# Patient Record
Sex: Female | Born: 1965 | Hispanic: No | Marital: Single | Smoking: Former smoker
Health system: Southern US, Community
[De-identification: ages and names within clinical notes are randomized; demographics above are authoritative.]

## PROBLEM LIST (undated history)

## (undated) DIAGNOSIS — N6459 Other signs and symptoms in breast: Secondary | ICD-10-CM

## (undated) DIAGNOSIS — E039 Hypothyroidism, unspecified: Secondary | ICD-10-CM

## (undated) DIAGNOSIS — IMO0002 Reserved for concepts with insufficient information to code with codable children: Secondary | ICD-10-CM

## (undated) DIAGNOSIS — E079 Disorder of thyroid, unspecified: Secondary | ICD-10-CM

## (undated) HISTORY — DX: Reserved for concepts with insufficient information to code with codable children: IMO0002

## (undated) HISTORY — PX: NO PAST SURGERIES: SHX2092

## (undated) HISTORY — DX: Disorder of thyroid, unspecified: E07.9

---

## 2003-03-31 ENCOUNTER — Inpatient Hospital Stay (HOSPITAL_COMMUNITY): Admission: AD | Admit: 2003-03-31 | Discharge: 2003-04-02 | Payer: Self-pay | Admitting: Obstetrics

## 2003-05-13 ENCOUNTER — Inpatient Hospital Stay (HOSPITAL_COMMUNITY): Admission: EM | Admit: 2003-05-13 | Discharge: 2003-05-14 | Payer: Self-pay | Admitting: Emergency Medicine

## 2005-05-30 DIAGNOSIS — Z923 Personal history of irradiation: Secondary | ICD-10-CM

## 2005-05-30 HISTORY — DX: Personal history of irradiation: Z92.3

## 2011-01-10 DIAGNOSIS — IMO0002 Reserved for concepts with insufficient information to code with codable children: Secondary | ICD-10-CM

## 2011-01-10 DIAGNOSIS — R87619 Unspecified abnormal cytological findings in specimens from cervix uteri: Secondary | ICD-10-CM

## 2011-01-10 HISTORY — DX: Unspecified abnormal cytological findings in specimens from cervix uteri: R87.619

## 2011-01-10 HISTORY — DX: Reserved for concepts with insufficient information to code with codable children: IMO0002

## 2011-03-03 ENCOUNTER — Encounter: Payer: Self-pay | Admitting: Obstetrics & Gynecology

## 2011-04-08 ENCOUNTER — Encounter: Payer: Self-pay | Admitting: *Deleted

## 2011-04-11 ENCOUNTER — Ambulatory Visit (INDEPENDENT_AMBULATORY_CARE_PROVIDER_SITE_OTHER): Payer: Self-pay | Admitting: Family Medicine

## 2011-04-11 ENCOUNTER — Other Ambulatory Visit (HOSPITAL_COMMUNITY)
Admission: RE | Admit: 2011-04-11 | Discharge: 2011-04-11 | Disposition: A | Discharge status: Home / Self Care | Payer: Self-pay | Source: Ambulatory Visit | Attending: Family Medicine | Admitting: Family Medicine

## 2011-04-11 ENCOUNTER — Encounter: Payer: Self-pay | Admitting: Family Medicine

## 2011-04-11 DIAGNOSIS — IMO0002 Reserved for concepts with insufficient information to code with codable children: Secondary | ICD-10-CM

## 2011-04-11 DIAGNOSIS — R87612 Low grade squamous intraepithelial lesion on cytologic smear of cervix (LGSIL): Secondary | ICD-10-CM

## 2011-04-11 DIAGNOSIS — N870 Mild cervical dysplasia: Secondary | ICD-10-CM | POA: Insufficient documentation

## 2011-04-11 DIAGNOSIS — Z01812 Encounter for preprocedural laboratory examination: Secondary | ICD-10-CM

## 2011-04-11 LAB — POCT PREGNANCY, URINE: Preg Test, Ur: NEGATIVE

## 2011-04-11 NOTE — Patient Instructions (Signed)
Colposcopa, Cuidado posterior (Colposcopy, Care After) La colposcopa es un procedimiento en el que se utiliza una herramienta especial para magnificar la superficie del cuello del tero. Tambin es posible que se tome una muestra de tejido (biopsia). Esta muestra se observar para identificar la presencia de cncer cervical u otros problemas. Despus del procedimiento:  Podr sentir algunos clicos.   Recustese algunos minutos si se siente mareada.   Podr tener un sangrado que debera detenerse luego de [place].  CUIDADOS EN EL HOGAR  No tenga relaciones sexuales ni use tampones durante 2 o 3 das o segn le hayan indicado.   Slo tome medicamentos como lo indique su mdico.   Contine tomando las pastillas anticonceptivas de la forma habitual.  Averige los [place] de su anlisis Pregunte cundo estarn [employer] del examen. Asegrese de [employer]. SOLICITE AYUDA DE INMEDIATO SI:  Tiene un sangrado abundante o elimina cogulos.   Su temperatura es de 102 F (38.9 C) o mayor.   Brett Fairy secrecin vaginal anormal.   Tiene clicos que no se [employer].   Siente mareos, vrtigo o pierde el conocimiento (se desmaya).  ASEGRESE DE QUE:   Comprende estas instrucciones.   Controlar su enfermedad.   Solicitar ayuda de inmediato si no mejora o si empeora.  Document Released: 06/18/2010 Document Revised: 01/26/2011 [facility] Patient Information 2012 [place], [place].

## 2011-04-11 NOTE — Progress Notes (Signed)
Addended by: Levie Heritage on: 04/11/2011 02:21 PM   Modules accepted: Level of Service

## 2011-04-11 NOTE — Progress Notes (Addendum)
Patient referred to our clinic from health department for colposcopy due to not seeing the SCJ or extents of lesions.  Patient given informed consent, signed copy in the chart, time out was performed.  Placed in lithotomy position. Cervix viewed with speculum and colposcope after application of acetic acid.   Colposcopy adequate?  yes [place] lesions?12 o'clock, 2 o'clock, 10 o'clock. Punctation?12 o'clock Mosaicism?  No  Abnormal vasculature? 12 o'clock Biopsies?12, 2, 10 o'clock ECC?yes  Patient was given post procedure instructions.  She will return in 3 weeks for results.  Dr Penne Lash supervised and assisted in the procedure.

## 2011-05-05 ENCOUNTER — Encounter: Payer: Self-pay | Admitting: Family Medicine

## 2011-05-05 ENCOUNTER — Ambulatory Visit (INDEPENDENT_AMBULATORY_CARE_PROVIDER_SITE_OTHER): Payer: Self-pay | Admitting: Family Medicine

## 2011-05-05 VITALS — BP 151/81 | HR 77 | Temp 97.4°F | Ht 59.0 in | Wt 140.7 lb

## 2011-05-05 DIAGNOSIS — N870 Mild cervical dysplasia: Secondary | ICD-10-CM | POA: Insufficient documentation

## 2011-05-05 NOTE — Patient Instructions (Signed)
VPH (HPV Test) Se trata de una prueba que se utiliza para detectar una infeccin por VPH. Algunos tipos de VPH estn asociados con cncer cervical. Esta prueba tambin se utiliza para la deteccin de Optometrist. Esta prueba se realiza si usted es [employer], tiene sntomas de VPH (verrugas genitales), tiene 30 aos de edad o ms o el [place] ha revelado un resultado anormal.  La prueba de VPH se realiza para Clinical research associate una evidencia de infeccin de tipos de virus del papiloma humano de Conservator, museum/gallery (VPH). El VPH es un grupo de 100 virus relacionados que causan verrugas en la piel y genitales (tambin llamado condylomata). En general en hombres y mujeres jvenes, la mayora de las infecciones por VPH son de corta duracin y Equities trader benignos. Unos pocos tipos de VPH (como HPV-16, HPV-18, HPV-31, y HPV-45), sin embargo, se consideran de Conservator, museum/gallery. No suelen ocasionar verrugas visibles, pero son persistentes y estn relacionados con cncer de cuello del tero, del pene y Heritage manager formas de cncer genital. Tradicionalmente, la infeccin por VPH ha sido detectada como cambios anormales en la clulas en un extendido de [place], una prueba que se [place] al principio para Engineer, site del cuello del tero (la parte ms baja del tero) o enfermedades que podran llevar a Hotel manager. Durante un [place], se evala la "normalidad" de las clulas del cuello del tero mirndolas con un microscopio.  PREPARACIN PARA LA PRUEBA Se toma una muestra de clulas de la zona cervical del tero durante una examen plvico con una especie de esptula de madera, hisopo o cepillo y si se efecta adems la prueba de ADN de VPH, se coloca en un lquido preservativo especial. VALORES NORMALES La mayora de las veces, no es IT trainer tipo de VPH. Sin embargo, si se Biomedical engineer, los hallazgos ms comunes son:   El VPH del tipo 6 y 11 normalmente ocasiona verrugas venreas y (junto con los tipos  42, 3 y 24) un bajo riesgo de English as a second language teacher.   Los tipos de VPH 16, 18, 31, 33 y 36 tienen un riesgo mayor de English as a second language teacher.  SIGNIFICADO DEL EXAMEN El mdico observar los resultado de la prueba y Agricultural engineer con usted su importancia. Los [address] de referencia dependen de [address] factores, entre los que se incluyen la edad del [place], [place], cantidad de la [place] y mtodo de [place]. Los resultados numricos tienen diferentes significados segn Quarry manager. El informe de laboratorio deber especificar el rango de normalidad para su prueba.  En una prueba de Pap, los cambios de "grado bajo" indican que es probable la presencia de VPH y la necesidad de otros estudios complementarios. Marcos Eke de ADN para VPH positivo indica la presencia de un tipo de VPH de alto riesgo, pero la prueba no especifica qu tipo es. Si ambos son negativos, es poco probable que haya un alto riesgo de infeccin por VPH. Si la prueba de Pap es anormal pero el ADN VPH es negativo, se realizar un seguimiento y se pedirn otros controles.  OBTENCIN DE LOS RESULTADOS DEL EXAMEN  Asegrese de obtener los [employer]. Pregunte cmo puede obtenerlos si no se lo han informado. Es su responsabilidad retirar el resultado del [place].   El Photographer instrucciones y si es necesario un tratamiento.  PARA MS INFORMACIN  [email or web address] Document Released: 09/01/2008 Document Revised: 01/26/2011 [facility] Patient Information 2012 [place], [place].

## 2011-05-05 NOTE — Progress Notes (Signed)
Patient seen for follow up of colposcopy.  She reports no complications, vaginal discharge, vaginal bleeding, cramping, abdominal pain.    Vital signs reviewed.  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  Will have patient return for PAP in 6 and 12 months.  If both are negative, would resume normal screening.  If either are ASCUS, LGSIL, or HGSIL, would need colposcopy.

## 2011-11-09 ENCOUNTER — Encounter: Payer: Self-pay | Admitting: Family Medicine

## 2014-03-31 ENCOUNTER — Encounter: Payer: Self-pay | Admitting: Family Medicine

## 2015-01-15 ENCOUNTER — Encounter: Payer: Self-pay | Admitting: *Deleted

## 2016-08-02 ENCOUNTER — Other Ambulatory Visit (HOSPITAL_COMMUNITY)
Admission: RE | Admit: 2016-08-02 | Discharge: 2016-08-02 | Disposition: A | Discharge status: Home / Self Care | Payer: BLUE CROSS/BLUE SHIELD | Source: Ambulatory Visit | Attending: Obstetrics and Gynecology | Admitting: Obstetrics and Gynecology

## 2016-08-02 ENCOUNTER — Other Ambulatory Visit: Payer: Self-pay | Admitting: Obstetrics and Gynecology

## 2016-08-02 DIAGNOSIS — Z01419 Encounter for gynecological examination (general) (routine) without abnormal findings: Secondary | ICD-10-CM | POA: Insufficient documentation

## 2016-08-02 DIAGNOSIS — Z1151 Encounter for screening for human papillomavirus (HPV): Secondary | ICD-10-CM | POA: Diagnosis present

## 2016-08-03 LAB — CYTOLOGY - PAP
Diagnosis: NEGATIVE
HPV: NOT DETECTED

## 2019-08-15 ENCOUNTER — Other Ambulatory Visit: Payer: Self-pay | Admitting: Radiology

## 2019-08-15 DIAGNOSIS — N6453 Retraction of nipple: Secondary | ICD-10-CM

## 2019-08-22 ENCOUNTER — Encounter: Payer: Self-pay | Admitting: *Deleted

## 2019-09-05 ENCOUNTER — Other Ambulatory Visit: Payer: Self-pay

## 2019-09-05 ENCOUNTER — Ambulatory Visit
Admission: RE | Admit: 2019-09-05 | Discharge: 2019-09-05 | Disposition: A | Discharge status: Home / Self Care | Payer: BLUE CROSS/BLUE SHIELD | Source: Ambulatory Visit | Attending: Radiology | Admitting: Radiology

## 2019-09-05 DIAGNOSIS — N6453 Retraction of nipple: Secondary | ICD-10-CM

## 2019-09-05 MED ORDER — GADOBUTROL 1 MMOL/ML IV SOLN
8.0000 mL | Freq: Once | INTRAVENOUS | Status: AC | PRN
  Administered 2019-09-05: 8 mL via INTRAVENOUS

## 2019-09-09 ENCOUNTER — Encounter: Payer: Self-pay | Admitting: *Deleted

## 2019-10-01 ENCOUNTER — Encounter: Payer: Self-pay | Admitting: *Deleted

## 2019-10-07 ENCOUNTER — Other Ambulatory Visit: Payer: Self-pay | Admitting: Radiology

## 2019-12-24 ENCOUNTER — Other Ambulatory Visit: Payer: Self-pay | Admitting: General Surgery

## 2020-01-10 ENCOUNTER — Encounter (HOSPITAL_BASED_OUTPATIENT_CLINIC_OR_DEPARTMENT_OTHER): Payer: Self-pay | Admitting: General Surgery

## 2020-01-10 ENCOUNTER — Other Ambulatory Visit: Payer: Self-pay

## 2020-01-13 ENCOUNTER — Other Ambulatory Visit (HOSPITAL_COMMUNITY)
Admission: RE | Admit: 2020-01-13 | Discharge: 2020-01-13 | Disposition: A | Discharge status: Home / Self Care | Payer: Medicaid Other | Source: Ambulatory Visit | Attending: General Surgery | Admitting: General Surgery

## 2020-01-13 DIAGNOSIS — Z01812 Encounter for preprocedural laboratory examination: Secondary | ICD-10-CM | POA: Diagnosis present

## 2020-01-13 DIAGNOSIS — Z20822 Contact with and (suspected) exposure to covid-19: Secondary | ICD-10-CM | POA: Diagnosis not present

## 2020-01-13 LAB — SARS CORONAVIRUS 2 (TAT 6-24 HRS): SARS Coronavirus 2: NEGATIVE

## 2020-01-16 ENCOUNTER — Ambulatory Visit (HOSPITAL_BASED_OUTPATIENT_CLINIC_OR_DEPARTMENT_OTHER): Payer: Medicaid Other | Admitting: Anesthesiology

## 2020-01-16 ENCOUNTER — Other Ambulatory Visit: Payer: Self-pay

## 2020-01-16 ENCOUNTER — Ambulatory Visit (HOSPITAL_BASED_OUTPATIENT_CLINIC_OR_DEPARTMENT_OTHER)
Admission: RE | Admit: 2020-01-16 | Discharge: 2020-01-16 | Disposition: A | Discharge status: Home / Self Care | Payer: Medicaid Other | Source: Ambulatory Visit | Attending: General Surgery | Admitting: General Surgery

## 2020-01-16 ENCOUNTER — Encounter (HOSPITAL_BASED_OUTPATIENT_CLINIC_OR_DEPARTMENT_OTHER): Payer: Self-pay | Admitting: General Surgery

## 2020-01-16 ENCOUNTER — Encounter (HOSPITAL_BASED_OUTPATIENT_CLINIC_OR_DEPARTMENT_OTHER)
Admission: RE | Disposition: A | Discharge status: Home / Self Care | Payer: Self-pay | Source: Ambulatory Visit | Attending: General Surgery

## 2020-01-16 DIAGNOSIS — E039 Hypothyroidism, unspecified: Secondary | ICD-10-CM | POA: Diagnosis not present

## 2020-01-16 DIAGNOSIS — R928 Other abnormal and inconclusive findings on diagnostic imaging of breast: Secondary | ICD-10-CM | POA: Diagnosis present

## 2020-01-16 DIAGNOSIS — Z87891 Personal history of nicotine dependence: Secondary | ICD-10-CM | POA: Diagnosis not present

## 2020-01-16 DIAGNOSIS — Z803 Family history of malignant neoplasm of breast: Secondary | ICD-10-CM | POA: Diagnosis not present

## 2020-01-16 DIAGNOSIS — E669 Obesity, unspecified: Secondary | ICD-10-CM | POA: Insufficient documentation

## 2020-01-16 DIAGNOSIS — Z6831 Body mass index (BMI) 31.0-31.9, adult: Secondary | ICD-10-CM | POA: Diagnosis not present

## 2020-01-16 DIAGNOSIS — Z79890 Hormone replacement therapy (postmenopausal): Secondary | ICD-10-CM | POA: Insufficient documentation

## 2020-01-16 DIAGNOSIS — D242 Benign neoplasm of left breast: Secondary | ICD-10-CM | POA: Diagnosis not present

## 2020-01-16 DIAGNOSIS — N6489 Other specified disorders of breast: Secondary | ICD-10-CM | POA: Insufficient documentation

## 2020-01-16 HISTORY — DX: Other signs and symptoms in breast: N64.59

## 2020-01-16 HISTORY — DX: Hypothyroidism, unspecified: E03.9

## 2020-01-16 HISTORY — PX: BREAST BIOPSY: SHX20

## 2020-01-16 SURGERY — BREAST BIOPSY
Anesthesia: General | Site: Breast | Laterality: Left

## 2020-01-16 MED ORDER — PROPOFOL 10 MG/ML IV BOLUS
INTRAVENOUS | Status: DC | PRN
  Administered 2020-01-16: 150 mg via INTRAVENOUS

## 2020-01-16 MED ORDER — FENTANYL CITRATE (PF) 100 MCG/2ML IJ SOLN
INTRAMUSCULAR | Status: DC | PRN
Start: 2020-01-16 — End: 2020-01-16
  Administered 2020-01-16 (×2): 50 ug via INTRAVENOUS

## 2020-01-16 MED ORDER — DEXAMETHASONE SODIUM PHOSPHATE 10 MG/ML IJ SOLN
INTRAMUSCULAR | Status: AC
  Filled 2020-01-16: qty 5

## 2020-01-16 MED ORDER — FENTANYL CITRATE (PF) 100 MCG/2ML IJ SOLN: 25.0000 ug | INTRAMUSCULAR | Status: DC | PRN

## 2020-01-16 MED ORDER — GABAPENTIN 100 MG PO CAPS
ORAL_CAPSULE | ORAL | Status: AC
  Filled 2020-01-16: qty 1

## 2020-01-16 MED ORDER — KETOROLAC TROMETHAMINE 15 MG/ML IJ SOLN
INTRAMUSCULAR | Status: AC
  Filled 2020-01-16: qty 1

## 2020-01-16 MED ORDER — ACETAMINOPHEN 500 MG PO TABS
1000.0000 mg | ORAL_TABLET | ORAL | Status: AC
  Administered 2020-01-16: 1000 mg via ORAL

## 2020-01-16 MED ORDER — PROMETHAZINE HCL 25 MG/ML IJ SOLN: 6.2500 mg | INTRAMUSCULAR | Status: DC | PRN

## 2020-01-16 MED ORDER — ACETAMINOPHEN 500 MG PO TABS
ORAL_TABLET | ORAL | Status: AC
  Filled 2020-01-16: qty 2

## 2020-01-16 MED ORDER — ONDANSETRON HCL 4 MG/2ML IJ SOLN
INTRAMUSCULAR | Status: DC | PRN
  Administered 2020-01-16: 4 mg via INTRAVENOUS

## 2020-01-16 MED ORDER — FENTANYL CITRATE (PF) 100 MCG/2ML IJ SOLN
INTRAMUSCULAR | Status: AC
Start: 2020-01-16 — End: ?
  Filled 2020-01-16: qty 2

## 2020-01-16 MED ORDER — BACITRACIN ZINC 500 UNIT/GM EX OINT
TOPICAL_OINTMENT | CUTANEOUS | Status: AC
  Filled 2020-01-16: qty 1.8

## 2020-01-16 MED ORDER — ENSURE PRE-SURGERY PO LIQD: 296.0000 mL | Freq: Once | ORAL | Status: DC

## 2020-01-16 MED ORDER — PROPOFOL 500 MG/50ML IV EMUL
INTRAVENOUS | Status: AC
  Filled 2020-01-16: qty 50

## 2020-01-16 MED ORDER — LIDOCAINE 2% (20 MG/ML) 5 ML SYRINGE
INTRAMUSCULAR | Status: AC
  Filled 2020-01-16: qty 30

## 2020-01-16 MED ORDER — EPHEDRINE 5 MG/ML INJ
INTRAVENOUS | Status: AC
  Filled 2020-01-16: qty 20

## 2020-01-16 MED ORDER — DEXAMETHASONE SODIUM PHOSPHATE 4 MG/ML IJ SOLN
INTRAMUSCULAR | Status: DC | PRN
  Administered 2020-01-16: 4 mg via INTRAVENOUS

## 2020-01-16 MED ORDER — GABAPENTIN 100 MG PO CAPS
100.0000 mg | ORAL_CAPSULE | ORAL | Status: AC
  Administered 2020-01-16: 100 mg via ORAL

## 2020-01-16 MED ORDER — LIDOCAINE HCL (CARDIAC) PF 100 MG/5ML IV SOSY
PREFILLED_SYRINGE | INTRAVENOUS | Status: DC | PRN
  Administered 2020-01-16: 60 mg via INTRAVENOUS

## 2020-01-16 MED ORDER — ONDANSETRON HCL 4 MG/2ML IJ SOLN
INTRAMUSCULAR | Status: AC
  Filled 2020-01-16: qty 26

## 2020-01-16 MED ORDER — CEFAZOLIN SODIUM-DEXTROSE 2-4 GM/100ML-% IV SOLN
INTRAVENOUS | Status: AC
  Filled 2020-01-16: qty 100

## 2020-01-16 MED ORDER — CEFAZOLIN SODIUM-DEXTROSE 2-4 GM/100ML-% IV SOLN
2.0000 g | INTRAVENOUS | Status: AC
  Administered 2020-01-16: 2 g via INTRAVENOUS

## 2020-01-16 MED ORDER — LACTATED RINGERS IV SOLN: INTRAVENOUS | Status: DC

## 2020-01-16 MED ORDER — PHENYLEPHRINE 40 MCG/ML (10ML) SYRINGE FOR IV PUSH (FOR BLOOD PRESSURE SUPPORT)
PREFILLED_SYRINGE | INTRAVENOUS | Status: AC
  Filled 2020-01-16: qty 30

## 2020-01-16 MED ORDER — MIDAZOLAM HCL 5 MG/5ML IJ SOLN
INTRAMUSCULAR | Status: DC | PRN
  Administered 2020-01-16: 2 mg via INTRAVENOUS

## 2020-01-16 MED ORDER — BUPIVACAINE HCL (PF) 0.25 % IJ SOLN
INTRAMUSCULAR | Status: DC | PRN
  Administered 2020-01-16: 8 mL

## 2020-01-16 MED ORDER — KETOROLAC TROMETHAMINE 15 MG/ML IJ SOLN
15.0000 mg | INTRAMUSCULAR | Status: AC
  Administered 2020-01-16: 15 mg via INTRAVENOUS

## 2020-01-16 MED ORDER — MIDAZOLAM HCL 2 MG/2ML IJ SOLN
INTRAMUSCULAR | Status: AC
  Filled 2020-01-16: qty 2

## 2020-01-16 SURGICAL SUPPLY — 58 items
APPLIER CLIP 9.375 MED OPEN (MISCELLANEOUS)
BINDER BREAST LRG (GAUZE/BANDAGES/DRESSINGS) IMPLANT
BINDER BREAST MEDIUM (GAUZE/BANDAGES/DRESSINGS) IMPLANT
BINDER BREAST XLRG (GAUZE/BANDAGES/DRESSINGS) ×3 IMPLANT
BINDER BREAST XXLRG (GAUZE/BANDAGES/DRESSINGS) IMPLANT
BLADE SURG 15 STRL LF DISP TIS (BLADE) ×1 IMPLANT
BLADE SURG 15 STRL SS (BLADE) ×2
CANISTER SUCT 1200ML W/VALVE (MISCELLANEOUS) IMPLANT
CHLORAPREP W/TINT 26 (MISCELLANEOUS) ×3 IMPLANT
CLIP APPLIE 9.375 MED OPEN (MISCELLANEOUS) IMPLANT
CLIP VESOCCLUDE SM WIDE 6/CT (CLIP) IMPLANT
CLOSURE WOUND 1/2 X4 (GAUZE/BANDAGES/DRESSINGS) ×1
COVER BACK TABLE 60X90IN (DRAPES) ×3 IMPLANT
COVER MAYO STAND STRL (DRAPES) ×3 IMPLANT
COVER WAND RF STERILE (DRAPES) IMPLANT
DECANTER SPIKE VIAL GLASS SM (MISCELLANEOUS) IMPLANT
DERMABOND ADVANCED (GAUZE/BANDAGES/DRESSINGS) ×2
DERMABOND ADVANCED .7 DNX12 (GAUZE/BANDAGES/DRESSINGS) ×1 IMPLANT
DRAPE LAPAROSCOPIC ABDOMINAL (DRAPES) ×3 IMPLANT
DRAPE UTILITY XL STRL (DRAPES) ×3 IMPLANT
DRSG TEGADERM 4X4.75 (GAUZE/BANDAGES/DRESSINGS) IMPLANT
ELECT COATED BLADE 2.86 ST (ELECTRODE) ×3 IMPLANT
ELECT REM PT RETURN 9FT ADLT (ELECTROSURGICAL) ×3
ELECTRODE REM PT RTRN 9FT ADLT (ELECTROSURGICAL) ×1 IMPLANT
GAUZE SPONGE 4X4 12PLY STRL LF (GAUZE/BANDAGES/DRESSINGS) ×3 IMPLANT
GLOVE BIO SURGEON STRL SZ7 (GLOVE) ×3 IMPLANT
GLOVE BIOGEL PI IND STRL 6.5 (GLOVE) ×1 IMPLANT
GLOVE BIOGEL PI IND STRL 7.0 (GLOVE) ×1 IMPLANT
GLOVE BIOGEL PI IND STRL 7.5 (GLOVE) ×1 IMPLANT
GLOVE BIOGEL PI INDICATOR 6.5 (GLOVE) ×2
GLOVE BIOGEL PI INDICATOR 7.0 (GLOVE) ×2
GLOVE BIOGEL PI INDICATOR 7.5 (GLOVE) ×2
GLOVE ECLIPSE 6.5 STRL STRAW (GLOVE) ×3 IMPLANT
GOWN STRL REUS W/ TWL LRG LVL3 (GOWN DISPOSABLE) ×3 IMPLANT
GOWN STRL REUS W/TWL LRG LVL3 (GOWN DISPOSABLE) ×6
HEMOSTAT ARISTA ABSORB 3G PWDR (HEMOSTASIS) IMPLANT
KIT MARKER MARGIN INK (KITS) ×3 IMPLANT
NEEDLE HYPO 25X1 1.5 SAFETY (NEEDLE) ×3 IMPLANT
NS IRRIG 1000ML POUR BTL (IV SOLUTION) IMPLANT
PACK BASIN DAY SURGERY FS (CUSTOM PROCEDURE TRAY) ×3 IMPLANT
PENCIL SMOKE EVACUATOR (MISCELLANEOUS) ×3 IMPLANT
RETRACTOR ONETRAX LX 90X20 (MISCELLANEOUS) IMPLANT
SLEEVE SCD COMPRESS KNEE MED (MISCELLANEOUS) ×3 IMPLANT
SPONGE LAP 4X18 RFD (DISPOSABLE) ×3 IMPLANT
STRIP CLOSURE SKIN 1/2X4 (GAUZE/BANDAGES/DRESSINGS) ×2 IMPLANT
SUT MNCRL AB 4-0 PS2 18 (SUTURE) IMPLANT
SUT MON AB 5-0 PS2 18 (SUTURE) ×3 IMPLANT
SUT SILK 2 0 SH (SUTURE) IMPLANT
SUT VIC AB 2-0 SH 27 (SUTURE) ×3
SUT VIC AB 2-0 SH 27XBRD (SUTURE) ×1 IMPLANT
SUT VIC AB 3-0 SH 27 (SUTURE) ×2
SUT VIC AB 3-0 SH 27X BRD (SUTURE) ×1 IMPLANT
SYR CONTROL 10ML LL (SYRINGE) ×3 IMPLANT
TOWEL GREEN STERILE FF (TOWEL DISPOSABLE) ×3 IMPLANT
TRAY FAXITRON CT DISP (TRAY / TRAY PROCEDURE) IMPLANT
TUBE CONNECTING 20'X1/4 (TUBING)
TUBE CONNECTING 20X1/4 (TUBING) IMPLANT
YANKAUER SUCT BULB TIP NO VENT (SUCTIONS) IMPLANT

## 2020-01-16 NOTE — Op Note (Signed)
Preoperative diagnosis: Left breast inverted nipple and MRI abnormality of retroareolar region Postoperative diagnosis: Same as above Procedure: Left breast excisional biopsy Surgeon: Dr. Serita Grammes Anesthesia: General Specimens: 1.  Left breast tissue marked with paint 2.  Left nipple specimen Complications: None Drains: None Estimated blood loss: Minimal Sponge and count was correct at completion Disposition to recovery in stable condition  Indications:54 yof whofor past year has had some left nipple itching and inversion. no discharge. no mass noted by her.she has undergone evaluation with first normal [place]. she has had an mri that shows some symmetric enhancement behind left nipple not amenable to biopsy. she then underwent US biopsy by Dr Luan Pulling with what appeared to be ra mass and this is dilated duct only. she is here to discuss options.  We elected to proceed with an excision of this retroareolar area.  Procedure: After informed consent was obtained the patient was taken to the operating room.  I discussed this with an interpreter present prior to beginning again.  She was placed under general anesthesia.  She was given antibiotics.  SCDs were in place.  She was prepped and draped in the standard sterile surgical fashion.  Surgical timeout was then performed.  She had some crusty areas in the nipple due to inversion that I removed.  There was no other abnormality really of the nipple outside of the inversion once I was able to examine it.  I then infiltrated Marcaine around the entire nipple areolar complex.  I made a periareolar incision in order to hide the scar later.  I then dissected under the nipple and areola and remove the retroareolar tissue.  I marked this with paint.  I then remove some very hard tissue that was excision of her nipple without broaching the skin.  This was passed off the table as a separate specimen.  I then closed the breast tissue after obtained  hemostasis with 2-0 Vicryl suture.  The skin was closed with 3-0 Vicryl and 5-0 Monocryl.  Glue and Steri-Strips were applied.  She tolerated this well was extubated and transferred to recovery stable.

## 2020-01-16 NOTE — Anesthesia Postprocedure Evaluation (Signed)
Anesthesia Post Note  Patient: Hser Belanger  Procedure(s) Performed: LEFT BREAST RETROAREOLAR EXCISIONAL BIOPSY (Left Breast)     Patient location during evaluation: PACU Anesthesia Type: General Level of consciousness: awake and alert, awake and oriented Pain management: pain level controlled Vital Signs Assessment: post-procedure vital signs reviewed and stable Respiratory status: spontaneous breathing, nonlabored ventilation, respiratory function stable and patient connected to nasal cannula oxygen Cardiovascular status: blood pressure returned to baseline and stable Postop Assessment: no apparent nausea or vomiting Anesthetic complications: no   No complications documented.  Last Vitals:  Vitals:   01/16/20 0900 01/16/20 0915  BP: 109/67 126/86  Pulse: 69 79  Resp: 15 16  Temp:    SpO2: 97% 98%    Last Pain:  Vitals:   01/16/20 0915  PainSc: 2                  Catalina Gravel

## 2020-01-16 NOTE — Anesthesia Procedure Notes (Signed)
Procedure Name: LMA Insertion Date/Time: 01/16/2020 8:14 AM Performed by: Signe Colt, CRNA Pre-anesthesia Checklist: Patient identified, Emergency Drugs available, Suction available and Patient being monitored Patient Re-evaluated:Patient Re-evaluated prior to induction Oxygen Delivery Method: Circle system utilized Preoxygenation: Pre-oxygenation with 100% oxygen Induction Type: IV induction Ventilation: Mask ventilation without difficulty LMA: LMA inserted LMA Size: 4.0 Number of attempts: 1 Airway Equipment and Method: Bite block Placement Confirmation: positive ETCO2 Tube secured with: Tape Dental Injury: Teeth and Oropharynx as per pre-operative assessment

## 2020-01-16 NOTE — Transfer of Care (Signed)
Immediate Anesthesia Transfer of Care Note  Patient: Jaime Dillon  Procedure(s) Performed: LEFT BREAST RETROAREOLAR EXCISIONAL BIOPSY (Left Breast)  Patient Location: PACU  Anesthesia Type:General  Level of Consciousness: sedated  Airway & Oxygen Therapy: Patient Spontanous Breathing and Patient connected to face mask oxygen  Post-op Assessment: Report given to RN and Post -op Vital signs reviewed and stable  Post vital signs: Reviewed and stable  Last Vitals:  Vitals Value Taken Time  BP 95/56 01/16/20 0845  Temp    Pulse 69 01/16/20 0846  Resp 32 01/16/20 0846  SpO2 97 % 01/16/20 0846  Vitals shown include unvalidated device data.  Last Pain:  Vitals:   01/16/20 0722  PainSc: 0-No pain      Patients Stated Pain Goal: 3 (17/51/02 5852)  Complications: No complications documented.

## 2020-01-16 NOTE — Discharge Instructions (Signed)
[facility] Office Phone Number [phone]  BREAST BIOPSY/ PARTIAL MASTECTOMY: POST OP INSTRUCTIONS Take 400 mg of ibuprofen every 8 hours or 650 mg tylenol every 6 hours for next 72 hours then as needed. Use ice several times daily also. Always review your discharge instruction sheet given to you by the facility where your surgery was performed.  IF YOU HAVE DISABILITY OR FAMILY LEAVE FORMS, YOU MUST BRING THEM TO THE OFFICE FOR PROCESSING.  DO NOT GIVE THEM TO YOUR DOCTOR.  1. A prescription for pain medication may be given to you upon discharge.  Take your pain medication as prescribed, if needed.  If narcotic pain medicine is not needed, then you may take acetaminophen (Tylenol), naprosyn (Alleve) or ibuprofen (Advil) as needed. 2. Take your usually prescribed medications unless otherwise directed 3. If you need a refill on your pain medication, please contact your pharmacy.  They will contact our office to request authorization.  Prescriptions will not be filled after 5pm or on week-ends. 4. You should eat very light the first 24 hours after surgery, such as soup, crackers, pudding, etc.  Resume your normal diet the day after surgery. 5. Most patients will experience some swelling and bruising in the breast.  Ice packs and a good support bra will help.  Wear the breast binder provided or a sports bra for 72 hours day and night.  After that wear a sports bra during the day until you return to the office. Swelling and bruising can take several days to resolve.  6. It is common to experience some constipation if taking pain medication after surgery.  Increasing fluid intake and taking a stool softener will usually help or prevent this problem from occurring.  A mild laxative (Milk of Magnesia or Miralax) should be taken according to package directions if there are no bowel movements after 48 hours. 7. Unless discharge instructions indicate otherwise, you may remove your bandages 48  hours after surgery and you may shower at that time.  You may have steri-strips (small skin tapes) in place directly over the incision.  These strips should be left on the skin for 7-10 days and will come off on their own.  If your surgeon used skin glue on the incision, you may shower in 24 hours.  The glue will flake off over the next 2-3 weeks.  Any sutures or staples will be removed at the office during your follow-up visit. 8. ACTIVITIES:  You may resume regular daily activities (gradually increasing) beginning the next day.  Wearing a good support bra or sports bra minimizes pain and swelling.  You may have sexual intercourse when it is comfortable. a. You may drive when you no longer are taking prescription pain medication, you can comfortably wear a seatbelt, and you can safely maneuver your car and apply brakes. b. RETURN TO WORK:  ______________________________________________________________________________________ 9. You should see your doctor in the office for a follow-up appointment approximately two weeks after your surgery.  Your doctor's nurse will typically make your follow-up appointment when she calls you with your pathology report.  Expect your pathology report 3-4 business days after your surgery.  You may call to check if you do not hear from us after three days. 10. OTHER INSTRUCTIONS: _______________________________________________________________________________________________ _____________________________________________________________________________________________________________________________________ _____________________________________________________________________________________________________________________________________ _____________________________________________________________________________________________________________________________________  WHEN TO CALL DR WAKEFIELD: 1. Fever over 101.0 2. Nausea and/or vomiting. 3. Extreme swelling or  bruising. 4. Continued bleeding from incision. 5. Increased pain, redness, or drainage from the incision.  The clinic   staff is available to answer your questions during regular business hours.  Please don't hesitate to call and ask to speak to one of the nurses for clinical concerns.  If you have a medical emergency, go to the nearest emergency room or call 911.  A surgeon from [facility] is always on call at the hospital.  For further questions, please visit [email or web address]   No Tylenol until 1:30 pm No Ibuprofen or Motrin until 1:30 pm  Post Anesthesia Home Care Instructions  Activity: Get plenty of rest for the remainder of the day. A responsible individual must stay with you for 24 hours following the procedure.  For the next 24 hours, DO NOT: -Drive a car -Paediatric nurse -Drink alcoholic beverages -Take any medication unless instructed by your physician -Make any legal decisions or sign important papers.  Meals: Start with liquid foods such as gelatin or soup. Progress to regular foods as tolerated. Avoid greasy, spicy, heavy foods. If nausea and/or vomiting occur, drink only clear liquids until the nausea and/or vomiting subsides. Call your physician if vomiting continues.  Special Instructions/Symptoms: Your throat may feel dry or sore from the anesthesia or the breathing tube placed in your throat during surgery. If this causes discomfort, gargle with warm salt water. The discomfort should disappear within 24 hours.  If you had a scopolamine patch placed behind your ear for the management of post- operative nausea and/or vomiting:  1. The medication in the patch is effective for 72 hours, after which it should be removed.  Wrap patch in a tissue and discard in the trash. Wash hands thoroughly with soap and water. 2. You may remove the patch earlier than 72 hours if you experience unpleasant side effects which may include dry mouth, dizziness or  visual disturbances. 3. Avoid touching the patch. Wash your hands with soap and water after contact with the patch.

## 2020-01-16 NOTE — H&P (Signed)
54 yof spanish speaking, daughter interpreted she declined other interpreter. she for past year has had some left nipple itching and inversion. no discharge. no mass noted by her. no prior history. she has fh of aunt with breast cancer over age 55 she has undergone evaluation with first normal [place]. she has had an mri that shows some symmetric enhancement behind left nipple not amenable to biopsy. she then underwent US biopsy by Dr Luan Pulling with what appeared to be ra mass and this is dilated duct only. she is here to discuss options.  Allergies (Chanel Teressa Senter, CMA; 12/24/2019 2:50 PM) No Known Drug Allergies  [12/24/2019]: Allergies Reconciled   Medication History (Chanel Teressa Senter, CMA; 12/24/2019 2:50 PM) Synthroid (137MCG Tablet, Oral) Active. Medications Reconciled  Pregnancy / Birth History Antonietta Jewel, [place]; 12/24/2019 2:50 PM) Para  3  Other Problems (Chanel Teressa Senter, CMA; 12/24/2019 2:50 PM) Thyroid Disease   Review of Systems ([place]; 12/24/2019 2:50 PM) General Not Present- Appetite Loss, Chills, Fatigue, Fever, Night Sweats, Weight Gain and Weight Loss. Skin Not Present- Change in Wart/Mole, Dryness, Hives, Jaundice, New Lesions, Non-Healing Wounds, Rash and Ulcer. HEENT Not Present- Earache, Hearing Loss, Hoarseness, Nose Bleed, Oral Ulcers, Ringing in the Ears, Seasonal Allergies, Sinus Pain, Sore Throat, Visual Disturbances, Wears glasses/contact lenses and Yellow Eyes. Respiratory Present- Snoring. Not Present- Bloody sputum, Chronic Cough, Difficulty Breathing and Wheezing. Gastrointestinal Not Present- Abdominal Pain, Bloating, Bloody Stool, Change in Bowel Habits, Chronic diarrhea, Constipation, Difficulty Swallowing, Excessive gas, Gets full quickly at meals, Hemorrhoids, Indigestion, Nausea, Rectal Pain and Vomiting.  Vitals (Chanel Nolan CMA; 12/24/2019 2:51 PM) 12/24/2019 2:50 PM Weight: 187 lb Height: 59in Body Surface Area: 1.79 m Body Mass Index: 37.77  kg/m  Temp.: 98.64F  Pulse: 106 (Regular)  BP: 134/86(Sitting, Left Arm, Standard)  Physical Exam Rolm Bookbinder MD; 12/24/2019 3:22 PM) General Mental Status-Alert. Orientation-Oriented X3. Breast Nipples Characteristics - Left - Inverted. Discharge - Bilateral - None - Bilateral. Lymphatic Head & Neck General Head & Neck Lymphatics: Bilateral - Description - Normal. Axillary General Axillary Region: Bilateral - Description - Normal. 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 I think likely this will be benign but she is concerned and does not necessarily want to do follow up. we discussed excision of RA tissue and not nac. will plan on doing soon

## 2020-01-16 NOTE — Interval H&P Note (Signed)
History and Physical Interval Note:  01/16/2020 8:00 AM  Jaime Dillon  has presented today for surgery, with the diagnosis of INVERTED NIPPLE, LEFT BREAST LESION.  The various methods of treatment have been discussed with the patient and family. After consideration of risks, benefits and other options for treatment, the patient has consented to  Procedure(s): LEFT BREAST RETROAREOLAR EXCISIONAL BIOPSY (Left) as a surgical intervention.  The patient's history has been reviewed, patient examined, no change in status, stable for surgery.  I have reviewed the patient's chart and labs.  Questions were answered to the patient's satisfaction.     Rolm Bookbinder

## 2020-01-16 NOTE — Anesthesia Preprocedure Evaluation (Addendum)
Anesthesia Evaluation  Patient identified by MRN, date of birth, ID band Patient awake    Reviewed: Allergy & Precautions, NPO status , Patient's Chart, lab work & pertinent test results  Airway Mallampati: II  TM Distance: >3 FB Neck ROM: Full    Dental  (+) Teeth Intact, Chipped,    Pulmonary former smoker,    Pulmonary exam normal breath sounds clear to auscultation       Cardiovascular negative cardio ROS Normal cardiovascular exam Rhythm:Regular Rate:Normal     Neuro/Psych negative neurological ROS     GI/Hepatic negative GI ROS, Neg liver ROS,   Endo/Other  Hypothyroidism Obesity   Renal/GU negative Renal ROS     Musculoskeletal negative musculoskeletal ROS (+)   Abdominal   Peds  Hematology negative hematology ROS (+)   Anesthesia Other Findings Day of surgery medications reviewed with the patient.  INVERTED NIPPLE, BREAST LESION  Reproductive/Obstetrics                            Anesthesia Physical Anesthesia Plan  ASA: II  Anesthesia Plan: General   Post-op Pain Management:    Induction: Intravenous  PONV Risk Score and Plan: 3 and Midazolam, Dexamethasone and Ondansetron  Airway Management Planned: LMA  Additional Equipment:   Intra-op Plan:   Post-operative Plan: Extubation in OR  Informed Consent: I have reviewed the patients History and Physical, chart, labs and discussed the procedure including the risks, benefits and alternatives for the proposed anesthesia with the patient or authorized representative who has indicated his/her understanding and acceptance.     Interpreter used for [employer] Discussed with: CRNA  Anesthesia Plan Comments:         Anesthesia Quick Evaluation

## 2020-01-17 ENCOUNTER — Encounter (HOSPITAL_BASED_OUTPATIENT_CLINIC_OR_DEPARTMENT_OTHER): Payer: Self-pay | Admitting: General Surgery

## 2020-01-17 LAB — SURGICAL PATHOLOGY

## 2020-02-06 ENCOUNTER — Encounter: Payer: Self-pay | Admitting: *Deleted

## 2021-11-12 IMAGING — MR MR BREAST BILAT WO/W CM
8 of 12 series · 32 of 48 positions shown · IV contrast (gadavist)
Comparison: Previous exam(s).

CLINICAL DATA: 54-year-old female with persistent LEFT nipple
retraction.

LABS:  None performed today
EXAM:
BILATERAL BREAST MRI WITH AND WITHOUT CONTRAST
TECHNIQUE: Multiplanar, multisequence MR images of both breasts were obtained
prior to and following the intravenous administration of 8 ml of
Gadavist

[Series 2: t2_tirm_tra ipat (a-p) · axial · 3.0mm · 0.70mm/px · 1 of 54 slices shown]
[im 1/54]
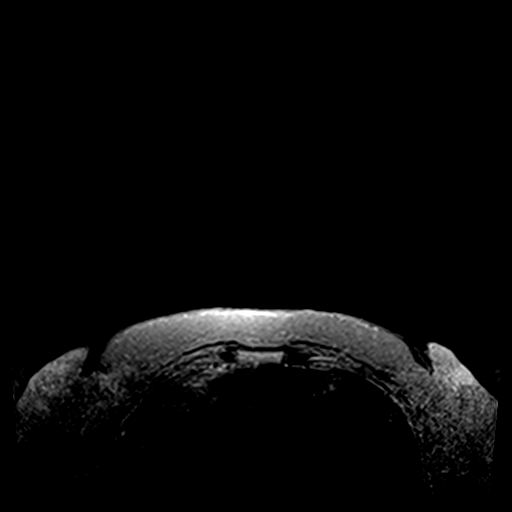

[Series 3: fl3d pre-cm no · axial · non-contrast · 1.2mm · 0.94mm/px · z∈[-21,+150]mm · 5 of 144 slices shown]
[im 1/144]
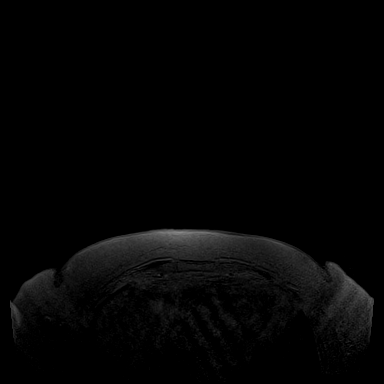
[im 36/144]
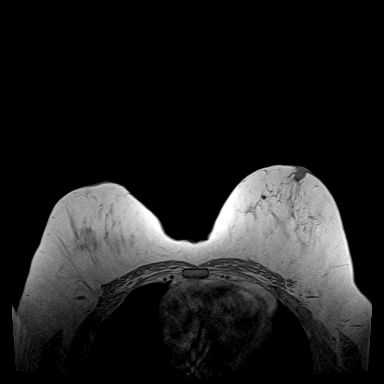
[im 72/144]
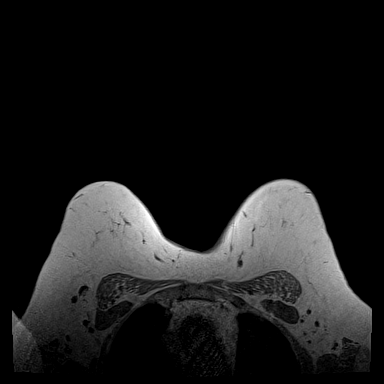
[im 108/144]
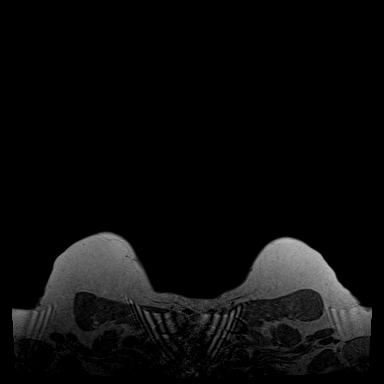
[im 144/144]
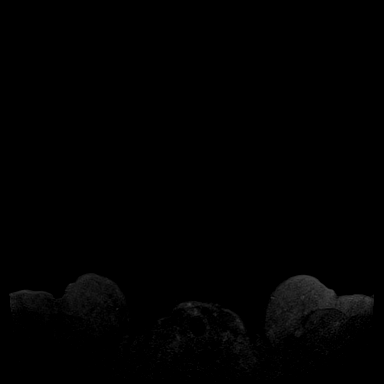

[Series 4: fl3d pre-cm · axial · non-contrast · 1.2mm · 0.94mm/px · z∈[-21,+150]mm · 5 of 144 slices shown]
[im 1/144]
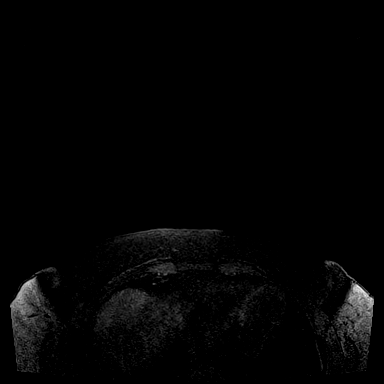
[im 36/144]
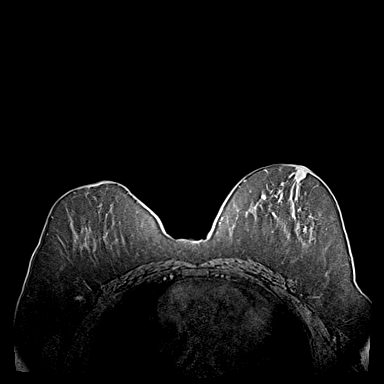
[im 72/144]
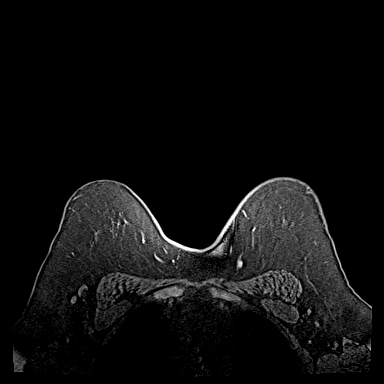
[im 108/144]
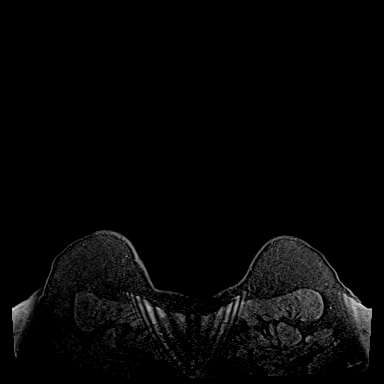
[im 144/144]
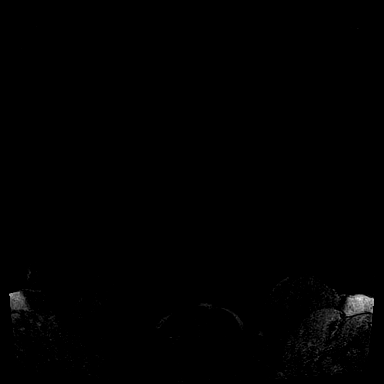

[Series 5: fl3d post-cm 20 · axial · 1.2mm · 0.94mm/px · z∈[-21,+150]mm · 5 of 144 slices shown (1 of 3)]
[im 1/144]
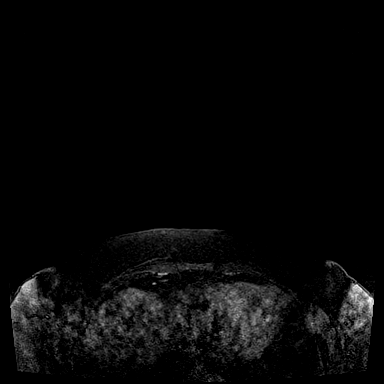
[im 36/144]
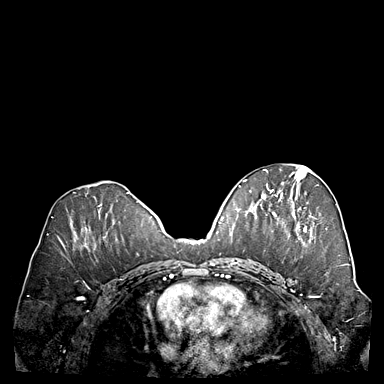
[im 72/144]
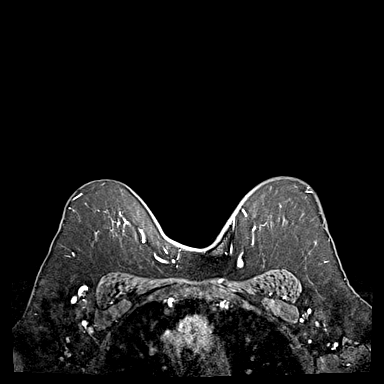
[im 108/144]
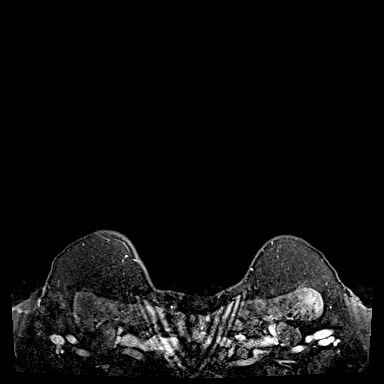
[im 144/144]
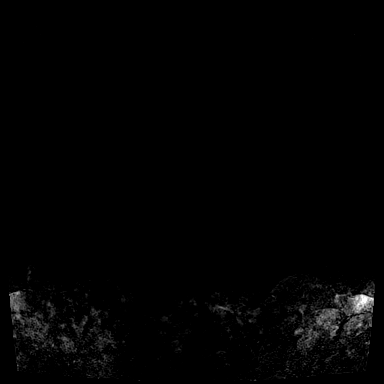

[Series 6: fl3d post-cm 20 · axial · 1.2mm · 0.94mm/px · z∈[-21,+150]mm · 5 of 144 slices shown (2 of 3)]
[im 1/144]
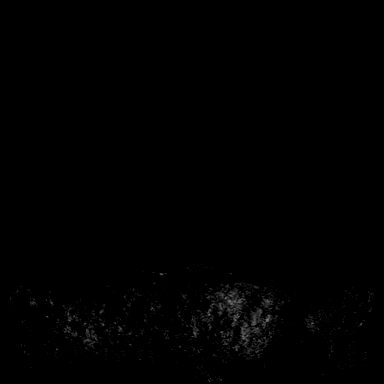
[im 36/144]
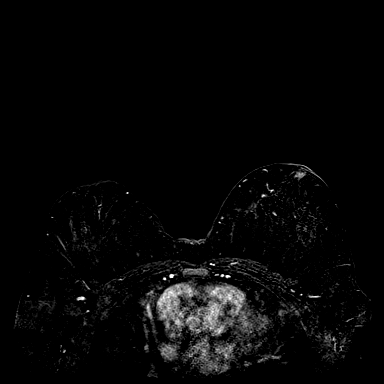
[im 72/144]
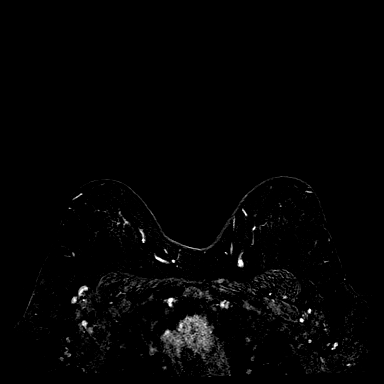
[im 108/144]
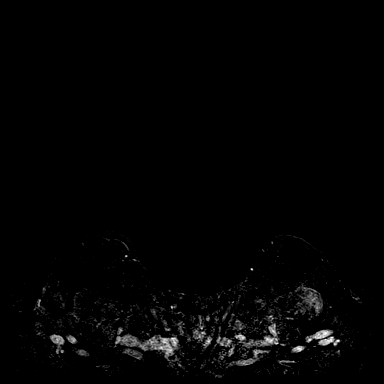
[im 144/144]
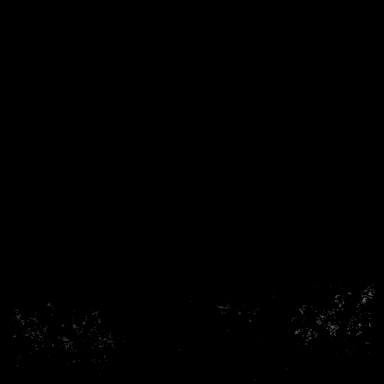

[Series 7: fl3d post-cm 20 · axial · 172.8mm · 0.94mm/px · 1 of 1 slices shown (3 of 3)]
[im 1/1]
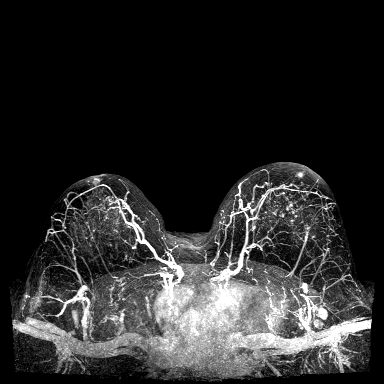

[Series 8: fl3d post-cm 3min · axial · 1.2mm · 0.94mm/px · z∈[-21,+150]mm · 6 of 144 slices shown]
[im 1/144]
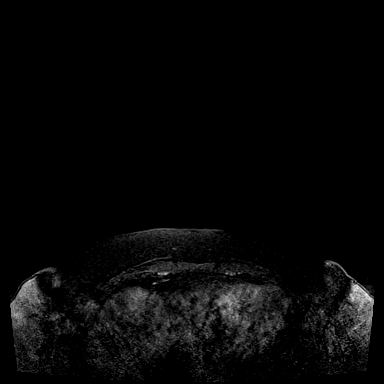
[im 29/144]
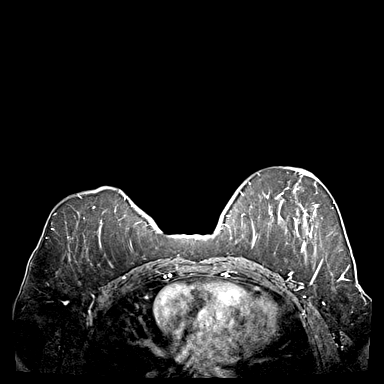
[im 58/144]
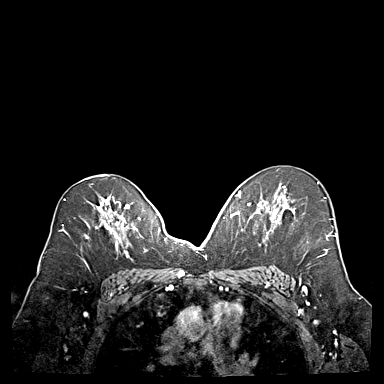
[im 86/144]
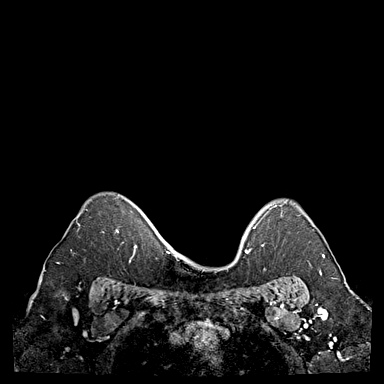
[im 115/144]
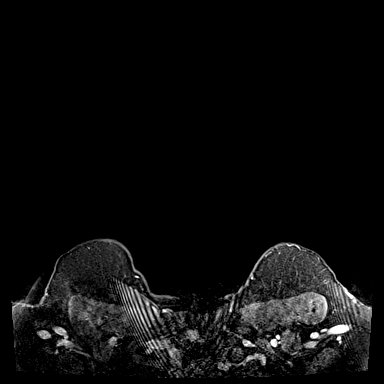
[im 144/144]
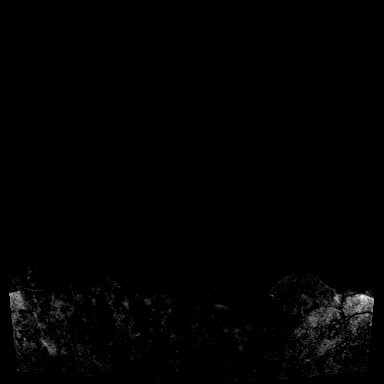

[Series 9: fl3d post-cm 3min_sub · axial · 1.2mm · 0.94mm/px · z∈[-21,+81]mm · 4 of 144 slices shown]
[im 1/144]
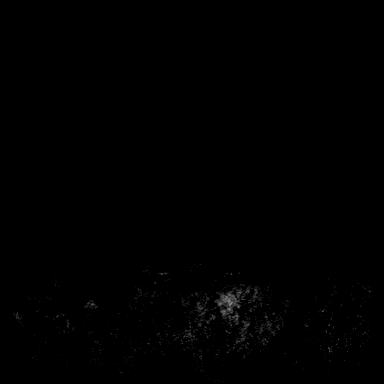
[im 29/144]
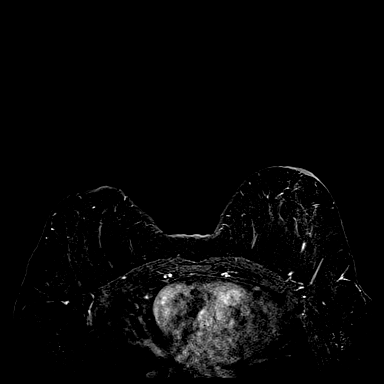
[im 58/144]
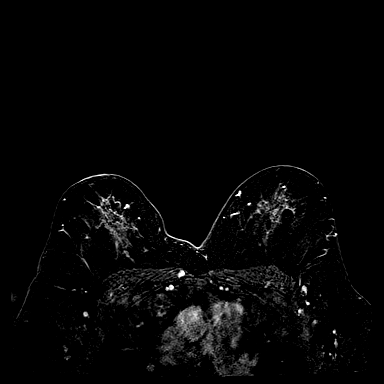
[im 86/144]
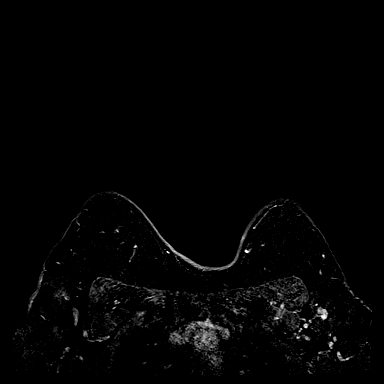

[32 of 48 positions shown; findings below may reference images not displayed]

Three-dimensional MR images were rendered by post-processing of the
original MR data on an independent workstation. The
three-dimensional MR images were interpreted, and findings are
reported in the following complete MRI report for this study. Three
dimensional images were evaluated at the independent [employer]
workstation
FINDINGS: Breast composition: b. Scattered fibroglandular tissue.

Background parenchymal enhancement: Mild

Right breast: No mass or abnormal enhancement.

Left breast: A 1 cm area of persistent non masslike enhancement is
identified immediately underlying LEFT nipple (image 111: Series
12). This area is much more prominent when compared to the RIGHT.

No other suspicious LEFT breast abnormalities are noted.

Lymph nodes: No abnormal appearing lymph nodes.

Ancillary findings:  None.
IMPRESSION: 1. Indeterminate 1 cm persistent non masslike enhancement
immediately underlying the LEFT nipple, more prominent when compared
to the RIGHT. This area may be technically difficult to biopsy under
MR guidance given location. As there is documented LEFT nipple
retraction, dedicated 8nd-look ultrasound is recommended. If no
corresponding sonographic abnormality is identified, surgical
consultation or attempt at MR biopsy would be recommended.
2. No MR evidence of RIGHT breast malignancy.
3. No abnormal appearing lymph nodes.

RECOMMENDATION:
8nd-look LEFT breast ultrasound. If no sonographic correlate is
identified to the 1 cm area of abnormal enhancement underlying the
LEFT nipple, recommend surgical consultation or possible attempted
MR biopsy (which may be technically inaccessible due to location).

BI-RADS CATEGORY  4: Suspicious.
